# Patient Record
Sex: Female | Born: 2000 | Race: White | Hispanic: No | Marital: Single | Smoking: Never smoker
Health system: Southern US, Community
[De-identification: ages and names within clinical notes are randomized; demographics above are authoritative.]

## PROBLEM LIST (undated history)

## (undated) DIAGNOSIS — R480 Dyslexia and alexia: Secondary | ICD-10-CM

## (undated) DIAGNOSIS — H539 Unspecified visual disturbance: Secondary | ICD-10-CM

## (undated) HISTORY — DX: Unspecified visual disturbance: H53.9

## (undated) HISTORY — PX: OTHER SURGICAL HISTORY: SHX169

## (undated) HISTORY — DX: Dyslexia and alexia: R48.0

---
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
## 2020-04-23 ENCOUNTER — Ambulatory Visit (INDEPENDENT_AMBULATORY_CARE_PROVIDER_SITE_OTHER): Payer: BC Managed Care – PPO | Admitting: Pediatrics

## 2020-04-23 ENCOUNTER — Encounter: Payer: Self-pay | Admitting: Pediatrics

## 2020-04-23 ENCOUNTER — Other Ambulatory Visit: Payer: Self-pay

## 2020-04-23 VITALS — Wt 133.4 lb

## 2020-04-23 DIAGNOSIS — R3989 Other symptoms and signs involving the genitourinary system: Secondary | ICD-10-CM | POA: Diagnosis not present

## 2020-04-23 DIAGNOSIS — R300 Dysuria: Secondary | ICD-10-CM | POA: Diagnosis not present

## 2020-04-23 LAB — POCT URINALYSIS DIPSTICK
Bilirubin, UA: NEGATIVE
Blood, UA: 250
Glucose, UA: NEGATIVE
Ketones, UA: NEGATIVE
Nitrite, UA: POSITIVE
Protein, UA: POSITIVE — AB
Spec Grav, UA: 1.025 (ref 1.010–1.025)
Urobilinogen, UA: 0.2 E.U./dL
pH, UA: 5 (ref 5.0–8.0)

## 2020-04-23 MED ORDER — NITROFURANTOIN MONOHYD MACRO 100 MG PO CAPS: 100.0000 mg | ORAL_CAPSULE | Freq: Two times a day (BID) | ORAL | 0 refills | Status: AC

## 2020-04-23 NOTE — Progress Notes (Signed)
Subjective:     History was provided by the patient. Gabriela Clay is a 19 y.o. female here for evaluation of dysuria and hematuria beginning this morning. Fever has been absent. Other associated symptoms include: abdominal pain and back pain. Symptoms which are not present include: constipation, diarrhea, headache, urinary frequency, urinary incontinence, urinary urgency, vaginal discharge, vaginal itching and vomiting. UTI history: no recent UTI's.  The following portions of the patient's history were reviewed and updated as appropriate: allergies, current medications, past family history, past medical history, past social history, past surgical history and problem list.  Review of Systems Pertinent items are noted in HPI    Objective:    Wt 133 lb 6.4 oz (60.5 kg)  General: alert, cooperative, appears stated age and no distress  Abdomen: soft, non-tender, without masses or organomegaly  CVA Tenderness: absent  GU: exam deferred   Lab review Results for orders placed or performed in visit on 04/23/20 (from the past 24 hour(s))  POCT urinalysis dipstick     Status: Abnormal   Collection Time: 04/23/20  3:11 PM  Result Value Ref Range   Color, UA amber    Clarity, UA cloudy    Glucose, UA Negative Negative   Bilirubin, UA neg    Ketones, UA neg    Spec Grav, UA 1.025 1.010 - 1.025   Blood, UA 250    pH, UA 5.0 5.0 - 8.0   Protein, UA Positive (A) Negative   Urobilinogen, UA 0.2 0.2 or 1.0 E.U./dL   Nitrite, UA pos    Leukocytes, UA Large (3+) (A) Negative   Appearance     Odor       Assessment:    Suspicious for UTI.    Plan:    Antibiotic as ordered; complete course.   UCS pending, will change abx if needed Follow up as needed

## 2020-04-23 NOTE — Patient Instructions (Signed)
1 capsul Macrobid 2 times a day for 10 days Drink plenty of water AZO and/or cranberry juice

## 2020-04-26 LAB — URINE CULTURE
MICRO NUMBER:: 10522268
SPECIMEN QUALITY:: ADEQUATE

## 2020-07-15 IMAGING — DX DG CHEST 2V
2 series · 2 of 2 positions shown · non-contrast
Comparison: None.

CLINICAL DATA: Fever.  Inguinal adenopathy

EXAM:
CHEST - 2 VIEW

[w chest lat]
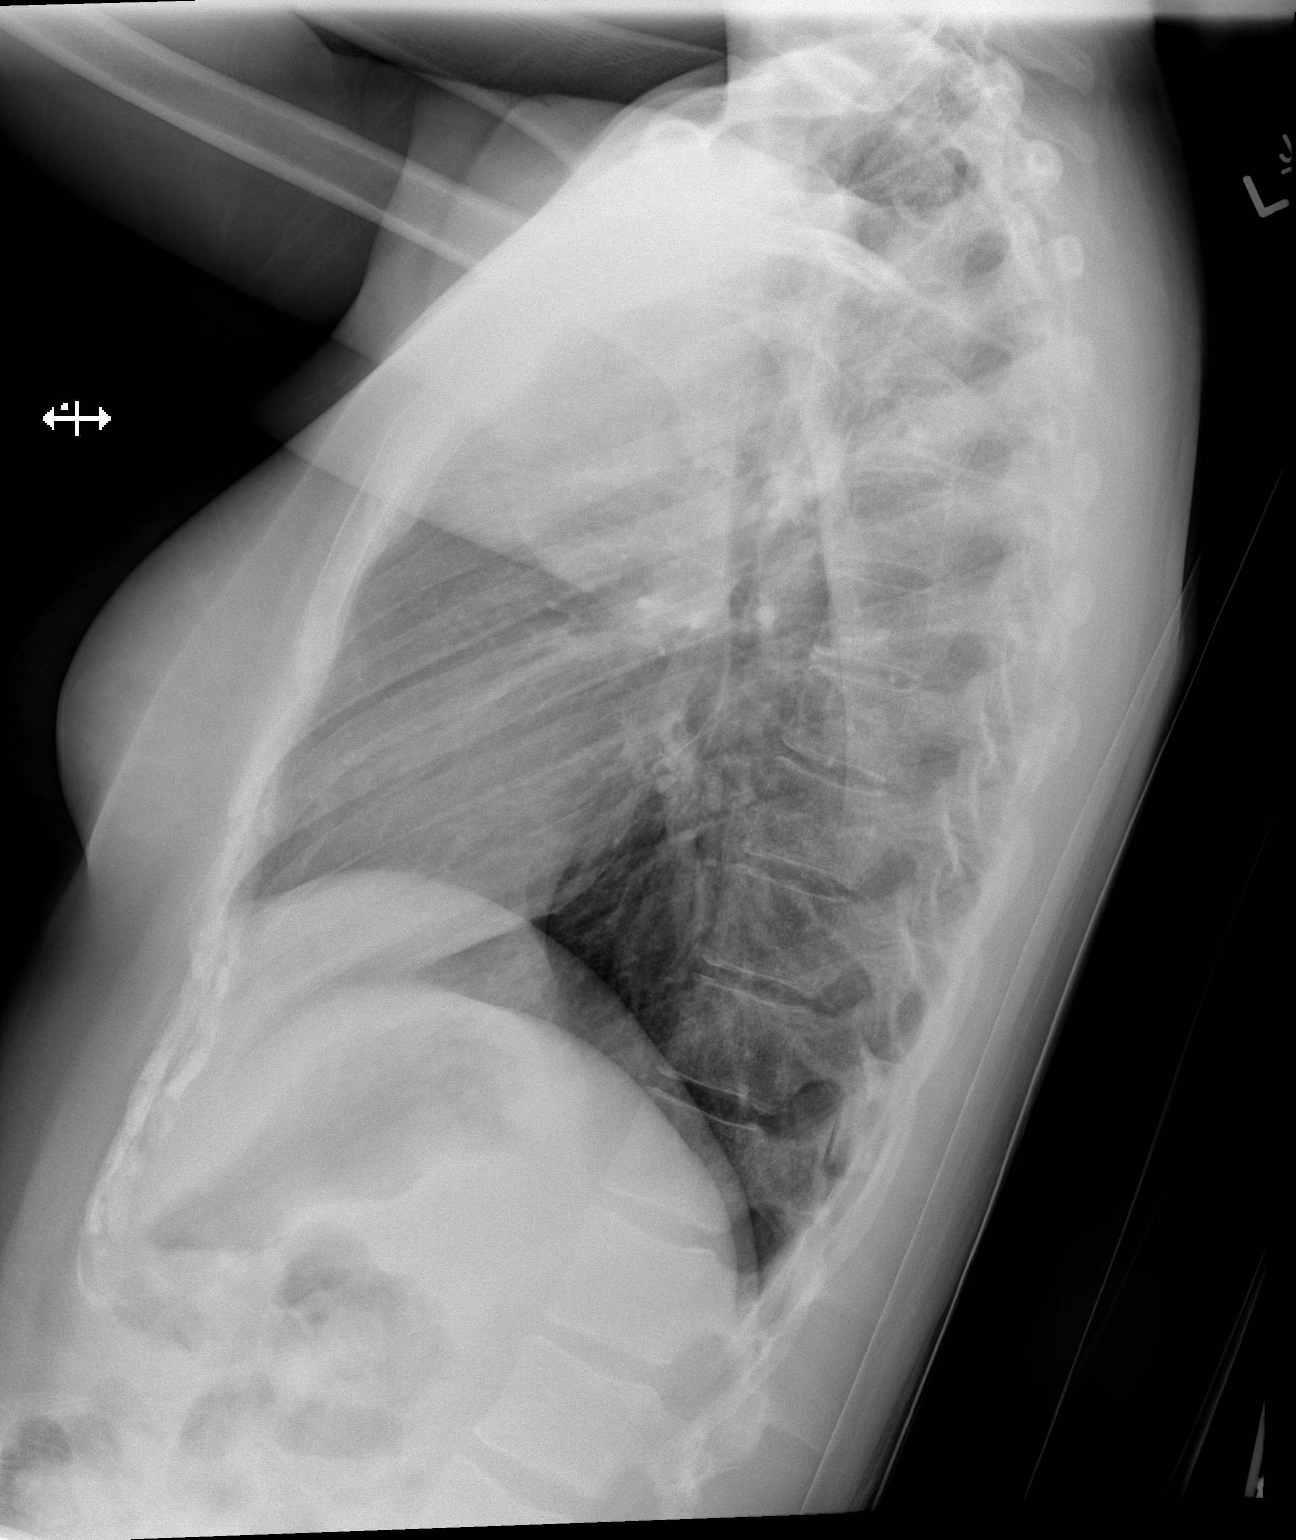

[x chest ap]
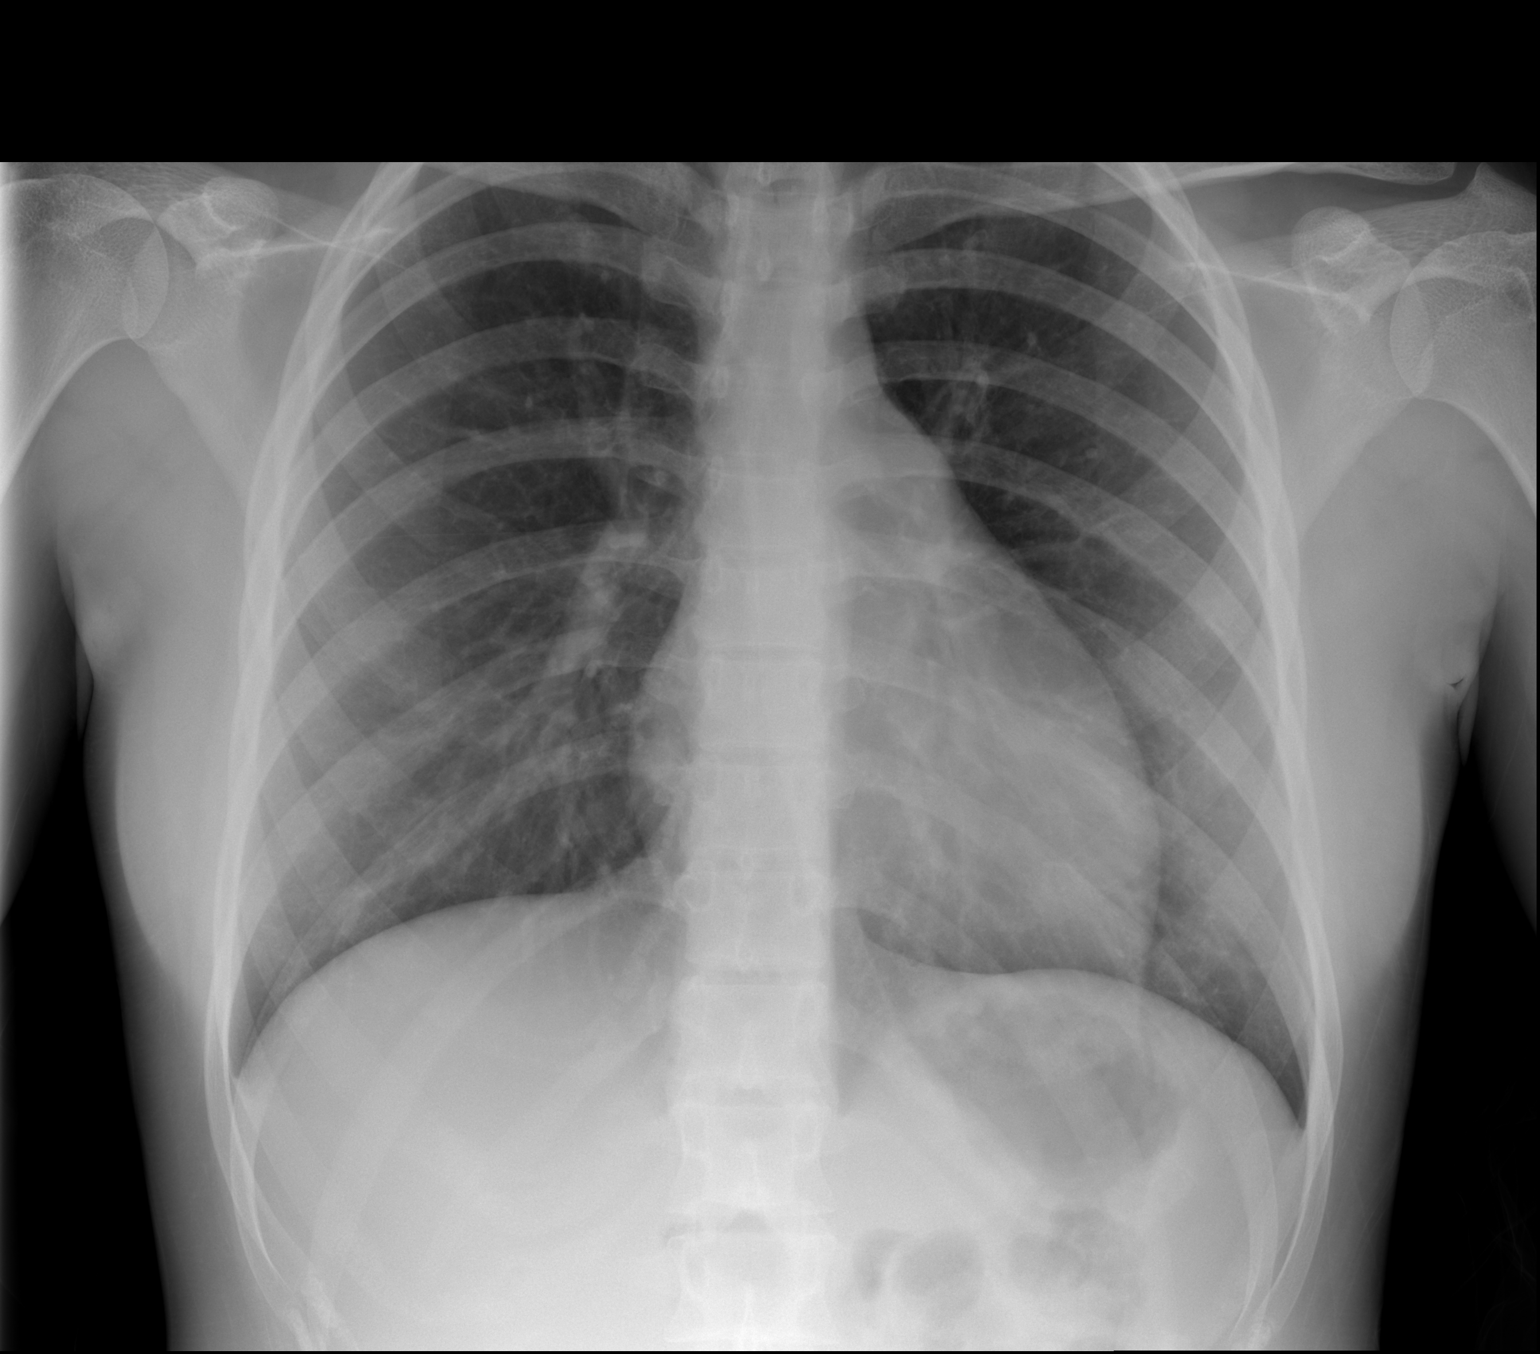

[2 of 2 positions shown; findings below may reference images not displayed]

FINDINGS: Lungs are clear. Heart size and pulmonary vascularity are normal. No
thoracic adenopathy demonstrable. No bone lesions.
IMPRESSION: No abnormality noted. In particular, no demonstrable thoracic
adenopathy. Lungs clear.

## 2020-07-22 ENCOUNTER — Ambulatory Visit (INDEPENDENT_AMBULATORY_CARE_PROVIDER_SITE_OTHER): Payer: BC Managed Care – PPO | Admitting: Pediatrics

## 2020-07-22 ENCOUNTER — Encounter: Payer: Self-pay | Admitting: Pediatrics

## 2020-07-22 ENCOUNTER — Other Ambulatory Visit: Payer: Self-pay

## 2020-07-22 VITALS — Wt 133.6 lb

## 2020-07-22 DIAGNOSIS — J029 Acute pharyngitis, unspecified: Secondary | ICD-10-CM

## 2020-07-22 LAB — POCT RAPID STREP A (OFFICE): Rapid Strep A Screen: NEGATIVE

## 2020-07-22 NOTE — Patient Instructions (Addendum)
Rapid strep negative, throat culture pending- no news is good news Ibuprofen every 6 hours as needed Warm salt water gargles and/or hot tea with honey Follow up as needed

## 2020-07-22 NOTE — Progress Notes (Signed)
Subjective:     Gabriela Clay is a 19 y.o. female who presents for evaluation of sore throat and right ear pain. Her throat started to hurt 4 days ago. She denies any fevers.    Treatment to date: none.  The following portions of the patient's history were reviewed and updated as appropriate: allergies, current medications, past family history, past medical history, past social history, past surgical history and problem list.  Review of Systems Pertinent items are noted in HPI.   Objective:    Wt 133 lb 9.6 oz (60.6 kg)  General appearance: alert, cooperative, appears stated age and no distress Head: Normocephalic, without obvious abnormality, atraumatic Eyes: conjunctivae/corneas clear. PERRL, EOM's intact. Fundi benign. Ears: normal TM's and external ear canals both ears Nose: Nares normal. Septum midline. Mucosa normal. No drainage or sinus tenderness. Throat: lips, mucosa, and tongue normal; teeth and gums normal Neck: no adenopathy, no carotid bruit, no JVD, supple, symmetrical, trachea midline and thyroid not enlarged, symmetric, no tenderness/mass/nodules Lungs: clear to auscultation bilaterally Heart: regular rate and rhythm, S1, S2 normal, no murmur, click, rub or gallop   Assessment:    viral pharyngitis   Plan:    Discussed diagnosis and treatment of URI. Suggested symptomatic OTC remedies. Nasal saline spray for congestion. Follow up as needed.   Rapid strep negative, throat culture pending. Will call patient and start on abx if culture results positive.

## 2020-07-25 LAB — CULTURE, GROUP A STREP
MICRO NUMBER:: 10864715
SPECIMEN QUALITY:: ADEQUATE

## 2020-09-05 DIAGNOSIS — Z6824 Body mass index (BMI) 24.0-24.9, adult: Secondary | ICD-10-CM | POA: Diagnosis not present

## 2020-09-05 DIAGNOSIS — N390 Urinary tract infection, site not specified: Secondary | ICD-10-CM | POA: Diagnosis not present

## 2021-09-02 DIAGNOSIS — A09 Infectious gastroenteritis and colitis, unspecified: Secondary | ICD-10-CM | POA: Diagnosis not present

## 2021-09-24 DIAGNOSIS — M26622 Arthralgia of left temporomandibular joint: Secondary | ICD-10-CM | POA: Diagnosis not present

## 2021-09-24 DIAGNOSIS — Z6826 Body mass index (BMI) 26.0-26.9, adult: Secondary | ICD-10-CM | POA: Diagnosis not present

## 2022-03-21 DIAGNOSIS — Z3202 Encounter for pregnancy test, result negative: Secondary | ICD-10-CM | POA: Diagnosis not present

## 2022-03-21 DIAGNOSIS — T384X5A Adverse effect of oral contraceptives, initial encounter: Secondary | ICD-10-CM | POA: Diagnosis not present

## 2022-03-21 DIAGNOSIS — N921 Excessive and frequent menstruation with irregular cycle: Secondary | ICD-10-CM | POA: Diagnosis not present

## 2022-03-21 DIAGNOSIS — Z3009 Encounter for other general counseling and advice on contraception: Secondary | ICD-10-CM | POA: Diagnosis not present

## 2022-09-09 DIAGNOSIS — Z3491 Encounter for supervision of normal pregnancy, unspecified, first trimester: Secondary | ICD-10-CM | POA: Diagnosis not present

## 2022-09-29 DIAGNOSIS — N911 Secondary amenorrhea: Secondary | ICD-10-CM | POA: Diagnosis not present

## 2022-10-08 DIAGNOSIS — Z3481 Encounter for supervision of other normal pregnancy, first trimester: Secondary | ICD-10-CM | POA: Diagnosis not present

## 2022-10-08 DIAGNOSIS — Z3685 Encounter for antenatal screening for Streptococcus B: Secondary | ICD-10-CM | POA: Diagnosis not present

## 2022-10-08 LAB — OB RESULTS CONSOLE ABO/RH: RH Type: POSITIVE

## 2022-10-08 LAB — HEPATITIS C ANTIBODY: HCV Ab: NEGATIVE

## 2022-10-08 LAB — OB RESULTS CONSOLE RPR: RPR: NONREACTIVE

## 2022-10-08 LAB — OB RESULTS CONSOLE ANTIBODY SCREEN: Antibody Screen: NEGATIVE

## 2022-10-08 LAB — OB RESULTS CONSOLE HIV ANTIBODY (ROUTINE TESTING): HIV: NONREACTIVE

## 2022-10-08 LAB — OB RESULTS CONSOLE RUBELLA ANTIBODY, IGM: Rubella: IMMUNE

## 2022-10-08 LAB — OB RESULTS CONSOLE HEPATITIS B SURFACE ANTIGEN: Hepatitis B Surface Ag: NEGATIVE

## 2022-10-14 DIAGNOSIS — Z3A09 9 weeks gestation of pregnancy: Secondary | ICD-10-CM | POA: Diagnosis not present

## 2022-10-14 DIAGNOSIS — K625 Hemorrhage of anus and rectum: Secondary | ICD-10-CM | POA: Diagnosis not present

## 2022-10-14 DIAGNOSIS — O2241 Hemorrhoids in pregnancy, first trimester: Secondary | ICD-10-CM | POA: Diagnosis not present

## 2022-10-14 DIAGNOSIS — O99611 Diseases of the digestive system complicating pregnancy, first trimester: Secondary | ICD-10-CM | POA: Diagnosis not present

## 2022-10-14 DIAGNOSIS — Z3481 Encounter for supervision of other normal pregnancy, first trimester: Secondary | ICD-10-CM | POA: Diagnosis not present

## 2022-10-14 DIAGNOSIS — K648 Other hemorrhoids: Secondary | ICD-10-CM | POA: Diagnosis not present

## 2022-10-21 DIAGNOSIS — Z113 Encounter for screening for infections with a predominantly sexual mode of transmission: Secondary | ICD-10-CM | POA: Diagnosis not present

## 2022-10-21 DIAGNOSIS — Z124 Encounter for screening for malignant neoplasm of cervix: Secondary | ICD-10-CM | POA: Diagnosis not present

## 2022-10-21 DIAGNOSIS — Z3481 Encounter for supervision of other normal pregnancy, first trimester: Secondary | ICD-10-CM | POA: Diagnosis not present

## 2022-10-21 DIAGNOSIS — Z3A10 10 weeks gestation of pregnancy: Secondary | ICD-10-CM | POA: Diagnosis not present

## 2022-10-21 LAB — OB RESULTS CONSOLE GC/CHLAMYDIA
Chlamydia: NEGATIVE
Neisseria Gonorrhea: NEGATIVE

## 2022-11-30 NOTE — L&D Delivery Note (Signed)
Operative Note  PROCEDURE DATE: 05/18/23   PREOPERATIVE DIAGNOSIS: Nonreassuring fetal heart tracing   POSTOPERATIVE DIAGNOSIS: The same   PROCEDURE:    Primary Low Transverse Cesarean Section   SURGEON:  Dr. Austin Vern Prestia   INDICATIONS: This is a 22 y.o. yo G1P0 at [redacted]w[redacted]d requiring cesarean section secondary to nonreassuring fetal heart tracing.  She underwent elective induction of labor and did not progress beyond 4 cm dilation.  FHT was notable for bouts of repetitive late decels after AROM and these resolved with position changes.  We were unable to begin pitocin due to intermittent late decelerations and labor was inadequate.  FHT then became more notable for recurrent and deeper late decelerations.   Decision made to proceed with LTCS. The risks of cesarean section discussed with the patient included but were not limited to: bleeding which may require transfusion or reoperation; infection which may require antibiotics; injury to bowel, bladder, ureters or other surrounding organs; injury to the fetus; need for additional procedures including hysterectomy in the event of a life-threatening hemorrhage; placental abnormalities wth subsequent pregnancies, incisional problems, thromboembolic phenomenon and other postoperative/anesthesia complications. The patient agreed with the proposed plan, giving informed consent for the procedure.     FINDINGS:  Viable female infant in vertex presentation, OP and asynclitic, APGARs p4 and 8,  Weight pending, Amniotic fluid light mec stained,  Intact placenta, three vessel cord.  Grossly normal uterus.  Arterial umbilical cord gas pH 7.28. Venous is 7.16, given lower reported venous this value may actually represent arterial.  .   ANESTHESIA:    Epidural ESTIMATED BLOOD LOSS: per record SPECIMENS: Placenta for routine COMPLICATIONS: None immediate    PROCEDURE IN DETAIL:  The patient received intravenous antibiotics (2g Ancef and azithromycin) and had  sequential compression devices applied to her lower extremities while in the preoperative area.  She was then taken to the operating room where epidural anesthesia was dosed up to surgical level and was found to be adequate. She was then placed in a dorsal supine position with a leftward tilt, and prepped and draped in a sterile manner.  A foley catheter was placed into her bladder and attached to constant gravity.  After an adequate timeout was performed, a Pfannenstiel skin incision was made with scalpel and carried through to the underlying layer of fascia. The fascia was incised in the midline and this incision was extended bilaterally using the Mayo scissors. Kocher clamps were applied to the superior aspect of the fascial incision and the underlying rectus muscles were dissected off bluntly. A similar process was carried out on the inferior aspect of the facial incision. The rectus muscles were separated in the midline bluntly and the peritoneum was entered bluntly.  A bladder flap was created sharply and developed bluntly. A transverse hysterotomy was made with a scalpel and extended bilaterally bluntly. The bladder blade was then removed. The infant was successfully delivered, and cord was clamped and cut and infant was handed over to awaiting neonatology team. Uterine massage was then administered and the placenta delivered intact with three-vessel cord. Cord gases collected. The uterus was cleared of clot and debris.  The hysterotomy was closed with 0 vicryl.  A second imbricating suture of 0-vicryl was used to reinforce the incision and aid in hemostasis.The fascia was closed with 0-Vicryl in a running fashion with good restoration of anatomy.  The subcutaneus tissue was irrigated and was reapproximated using running plain gut.  The skin was closed with 4-0 Vicryl in   a subcuticular fashion.  All surgical site and was hemostatic at end of procedure without any further bleeding on exam.    Pt tolerated  the procedure well. All sponge/lap/needle counts were correct  X 2. Pt taken to recovery room in stable condition.     Austin Dejon Lukas MD  

## 2022-12-24 DIAGNOSIS — Z363 Encounter for antenatal screening for malformations: Secondary | ICD-10-CM | POA: Diagnosis not present

## 2022-12-24 DIAGNOSIS — Z3A19 19 weeks gestation of pregnancy: Secondary | ICD-10-CM | POA: Diagnosis not present

## 2023-01-21 DIAGNOSIS — Z3A23 23 weeks gestation of pregnancy: Secondary | ICD-10-CM | POA: Diagnosis not present

## 2023-01-21 DIAGNOSIS — Z362 Encounter for other antenatal screening follow-up: Secondary | ICD-10-CM | POA: Diagnosis not present

## 2023-02-17 DIAGNOSIS — Z3480 Encounter for supervision of other normal pregnancy, unspecified trimester: Secondary | ICD-10-CM | POA: Diagnosis not present

## 2023-02-17 DIAGNOSIS — Z23 Encounter for immunization: Secondary | ICD-10-CM | POA: Diagnosis not present

## 2023-04-20 DIAGNOSIS — Z3685 Encounter for antenatal screening for Streptococcus B: Secondary | ICD-10-CM | POA: Diagnosis not present

## 2023-04-27 LAB — OB RESULTS CONSOLE GBS: GBS: POSITIVE

## 2023-05-04 ENCOUNTER — Inpatient Hospital Stay (HOSPITAL_COMMUNITY)
Admission: AD | Admit: 2023-05-04 | Discharge: 2023-05-05 | Disposition: A | Discharge status: Home / Self Care | Payer: BC Managed Care – PPO | Attending: Obstetrics and Gynecology | Admitting: Obstetrics and Gynecology

## 2023-05-04 ENCOUNTER — Encounter (HOSPITAL_COMMUNITY): Payer: Self-pay | Admitting: Obstetrics and Gynecology

## 2023-05-04 DIAGNOSIS — O471 False labor at or after 37 completed weeks of gestation: Secondary | ICD-10-CM | POA: Insufficient documentation

## 2023-05-04 DIAGNOSIS — O479 False labor, unspecified: Secondary | ICD-10-CM

## 2023-05-04 DIAGNOSIS — Z3A38 38 weeks gestation of pregnancy: Secondary | ICD-10-CM | POA: Insufficient documentation

## 2023-05-04 NOTE — MAU Note (Signed)
..  Gabriela Clay is a 22 y.o. at [redacted]w[redacted]d here in MAU reporting:   Contractions every: 8 minutes Onset of ctx: Today 7pm Pain score: 8/10  ROM: Intact Vaginal Bleeding: None   Fetal Movement: Reports positive FM FHT: Patient taken straight to room  Vitals:   05/04/23 2249  BP: 116/82  Pulse: (!) 107  Resp: 18  Temp: 98.2 F (36.8 C)       GBS: Positive HSV: Denies hx of HSV Lab orders placed from triage: MAU Labor Eval

## 2023-05-05 DIAGNOSIS — O479 False labor, unspecified: Secondary | ICD-10-CM | POA: Diagnosis not present

## 2023-05-05 DIAGNOSIS — Z3A38 38 weeks gestation of pregnancy: Secondary | ICD-10-CM

## 2023-05-05 DIAGNOSIS — O471 False labor at or after 37 completed weeks of gestation: Secondary | ICD-10-CM | POA: Diagnosis not present

## 2023-05-05 NOTE — MAU Provider Note (Signed)
None    S: [name] is a 22 y.o. G1P0 at [redacted]w[redacted]d  who presents to MAU today complaining contractions q 8 minutes  She denies vaginal bleeding. She denies LOF. She reports normal fetal movement.    O: BP 112/78   Pulse 93   Temp 98.2 F (36.8 C) (Oral)   Resp 18   Ht 5\' 3"  (1.6 m)   Wt 78.5 kg   SpO2 98%   BMI 30.66 kg/m  GENERAL: Well-developed, well-nourished female in no acute distress.  HEAD: Normocephalic, atraumatic.  CHEST: Normal effort of breathing, regular heart rate ABDOMEN: Soft, nontender, gravid  Cervical exam:  Dilation: Fingertip Effacement (%): 50 Cervical Position: Posterior Exam by:: Anastasio Champion, RN   Fetal Monitoring: Baseline: 145 Variability: average Accelerations: present Decelerations: none Contractions: irregular, q7-7min   A: SIUP at [redacted]w[redacted]d  False labor  P: Discharge home Labor precautions Encouraged to return if she develops worsening of symptoms, increase in pain, fever, or other concerning symptoms.     Aviva Signs, CNM 05/05/2023 12:08 AM

## 2023-05-09 ENCOUNTER — Encounter (HOSPITAL_COMMUNITY): Payer: Self-pay | Admitting: Obstetrics and Gynecology

## 2023-05-09 ENCOUNTER — Inpatient Hospital Stay (HOSPITAL_COMMUNITY)
Admission: AD | Admit: 2023-05-09 | Discharge: 2023-05-09 | Disposition: A | Discharge status: Home / Self Care | Payer: BC Managed Care – PPO | Attending: Obstetrics and Gynecology | Admitting: Obstetrics and Gynecology

## 2023-05-09 DIAGNOSIS — Z3A38 38 weeks gestation of pregnancy: Secondary | ICD-10-CM | POA: Insufficient documentation

## 2023-05-09 DIAGNOSIS — O26893 Other specified pregnancy related conditions, third trimester: Secondary | ICD-10-CM | POA: Insufficient documentation

## 2023-05-09 DIAGNOSIS — O471 False labor at or after 37 completed weeks of gestation: Secondary | ICD-10-CM

## 2023-05-09 DIAGNOSIS — R030 Elevated blood-pressure reading, without diagnosis of hypertension: Secondary | ICD-10-CM | POA: Insufficient documentation

## 2023-05-09 LAB — COMPREHENSIVE METABOLIC PANEL
ALT: 8 U/L (ref 0–44)
AST: 16 U/L (ref 15–41)
Albumin: 2.8 g/dL — ABNORMAL LOW (ref 3.5–5.0)
Alkaline Phosphatase: 228 U/L — ABNORMAL HIGH (ref 38–126)
Anion gap: 11 (ref 5–15)
BUN: 6 mg/dL (ref 6–20)
CO2: 21 mmol/L — ABNORMAL LOW (ref 22–32)
Calcium: 9.3 mg/dL (ref 8.9–10.3)
Chloride: 102 mmol/L (ref 98–111)
Creatinine, Ser: 0.7 mg/dL (ref 0.44–1.00)
GFR, Estimated: >60 mL/min (ref >60)
Glucose, Bld: 82 mg/dL (ref 70–99)
Potassium: 5 mmol/L (ref 3.5–5.1)
Sodium: 134 mmol/L — ABNORMAL LOW (ref 135–145)
Total Bilirubin: 0.5 mg/dL (ref 0.3–1.2)
Total Protein: 6.8 g/dL (ref 6.5–8.1)

## 2023-05-09 LAB — CBC
HCT: 39.7 % (ref 36.0–46.0)
Hemoglobin: 12.6 g/dL (ref 12.0–15.0)
MCH: 27.9 pg (ref 26.0–34.0)
MCHC: 31.7 g/dL (ref 30.0–36.0)
MCV: 88 fL (ref 80.0–100.0)
Platelets: 289 10*3/uL (ref 150–400)
RBC: 4.51 MIL/uL (ref 3.87–5.11)
RDW: 12.3 % (ref 11.5–15.5)
WBC: 10.6 10*3/uL — ABNORMAL HIGH (ref 4.0–10.5)
nRBC: 0 % (ref 0.0–0.2)

## 2023-05-09 LAB — PROTEIN / CREATININE RATIO, URINE
Creatinine, Urine: 128 mg/dL
Protein Creatinine Ratio: 0.17 mg/mg{Cre} — ABNORMAL HIGH (ref 0.00–0.15)
Total Protein, Urine: 22 mg/dL

## 2023-05-09 NOTE — MAU Note (Signed)
..  Gabriela Clay is a 22 y.o. at [redacted]w[redacted]d here in MAU reporting: CTX since yesterday around 1000. She reports they were every 8 minutes and finally fell asleep around 0600. She reports she woke back up around 0800 and began tracking her CTX's again and they were every 3 minutes and her app told her to come to the hospital. She reports she lives an hour away in [place]. Reports 3 softer bowel movements since yesterday. Denies VB. +FM.  GBS+.  Onset of complaint: 1000 yesterday Pain score: 9/10 lower abdomen and back  FHT: wearing a romper - unable to assess Lab orders placed from triage:

## 2023-05-09 NOTE — Discharge Instructions (Signed)
Reasons to return to MAU at [facility]:  1.  Contractions are  5 minutes apart or less, each last 1 minute, these have been going on for 1-2 hours, and you cannot walk or talk during them 2.  You have a large gush of fluid, or a trickle of fluid that will not stop and you have to wear a pad 3.  You have bleeding that is bright red, heavier than spotting--like menstrual bleeding (spotting can be normal in early labor or after a check of your cervix) 4.  You do not feel the baby moving like he/she normally does  

## 2023-05-09 NOTE — MAU Provider Note (Signed)
[name] is a G1P0 at [redacted]w[redacted]d seen in MAU for labor. RN labor check, seen by provider for elevated BP x 1.  SVE by RN Dilation: Fingertip Effacement (%): Thick Cervical Position: Posterior Station: Ballotable Exam by:: Valla Leaver, RN   NST - FHR: 130 bpm / moderate variability / accels present / decels absent NST reactive / TOCO: regular every 2-10 mins   Patient Vitals for the past 24 hrs:  BP Temp Temp src Pulse Resp SpO2 Height Weight  05/09/23 1211 98/71 -- -- 79 -- -- -- --  05/09/23 1208 109/72 -- -- 87 18 -- -- --  05/09/23 1201 (!) 149/90 -- -- 96 -- -- -- --  05/09/23 1151 115/80 -- -- 86 -- -- -- --  05/09/23 1147 115/84 -- -- 94 -- -- -- --  05/09/23 1137 123/83 98.3 F (36.8 C) Oral 94 16 100 % -- --  05/09/23 1134 -- -- -- -- -- -- 5\' 3"  (1.6 m) 76.8 kg    Results for orders placed or performed during the hospital encounter of 05/09/23 (from the past 24 hour(s))  Protein / creatinine ratio, urine     Status: Abnormal   Collection Time: 05/09/23 12:25 PM  Result Value Ref Range   Creatinine, Urine 128 mg/dL   Total Protein, Urine 22 mg/dL   Protein Creatinine Ratio 0.17 (H) 0.00 - 0.15 mg/mg[Cre]  CBC     Status: Abnormal   Collection Time: 05/09/23 12:53 PM  Result Value Ref Range   WBC 10.6 (H) 4.0 - 10.5 K/uL   RBC 4.51 3.87 - 5.11 MIL/uL   Hemoglobin 12.6 12.0 - 15.0 g/dL   HCT 65.7 84.6 - 96.2 %   MCV 88.0 80.0 - 100.0 fL   MCH 27.9 26.0 - 34.0 pg   MCHC 31.7 30.0 - 36.0 g/dL   RDW 95.2 84.1 - 32.4 %   Platelets 289 150 - 400 K/uL   nRBC 0.0 0.0 - 0.2 %  Comprehensive metabolic panel     Status: Abnormal   Collection Time: 05/09/23 12:53 PM  Result Value Ref Range   Sodium 134 (L) 135 - 145 mmol/L   Potassium 5.0 3.5 - 5.1 mmol/L   Chloride 102 98 - 111 mmol/L   CO2 21 (L) 22 - 32 mmol/L   Glucose, Bld 82 70 - 99 mg/dL   BUN 6 6 - 20 mg/dL   Creatinine, Ser 4.01 0.44 - 1.00 mg/dL   Calcium 9.3 8.9 - 02.7 mg/dL   Total Protein 6.8 6.5 - 8.1  g/dL   Albumin 2.8 (L) 3.5 - 5.0 g/dL   AST 16 15 - 41 U/L   ALT 8 0 - 44 U/L   Alkaline Phosphatase 228 (H) 38 - 126 U/L   Total Bilirubin 0.5 0.3 - 1.2 mg/dL   GFR, Estimated >25 >36 mL/min   Anion gap 11 5 - 15    MDM: CNM performed medical screen for elevated BP x 1 in MAU.  PEC labs ordered and wnl. No s/sx of PEC.  Pt BP grossly normal except for one mild range BP.    Plan:  D/C home with labor precautions Keep scheduled appt with [facility]  Sharen Counter, CNM  05/09/2023 2:13 PM

## 2023-05-09 NOTE — Progress Notes (Signed)
Patient laying on BP cuff with arm bent

## 2023-05-14 ENCOUNTER — Telehealth (HOSPITAL_COMMUNITY): Payer: Self-pay | Admitting: *Deleted

## 2023-05-14 NOTE — Telephone Encounter (Unsigned)
Preadmission screen  

## 2023-05-14 NOTE — H&P (Signed)
Gabriela Clay is a 22 y.o. female presenting for eIOL. Pregnancy uncomplicated except for GBS positive. OB History     Gravida  1   Para      Term      Preterm      AB      Living         SAB      IAB      Ectopic      Multiple      Live Births             Past Medical History:  Diagnosis Date   Dyslexia    Reading & writing   Vision abnormalities    wears glasses for reading   Past Surgical History:  Procedure Laterality Date   none     Family History: family history includes Arthritis in her maternal grandfather; Asthma in her paternal grandmother; Cancer in her maternal grandmother and paternal grandmother; Coronary artery disease in her father; Early death (age of onset: 90) in her maternal grandfather; Heart disease (age of onset: 19) in her paternal grandfather; Hyperlipidemia in her father; Hypertension in her father; Kidney Stones in her father. Social History:  reports that she has never smoked. She has never used smokeless tobacco. She reports that she does not drink alcohol and does not use drugs.     Maternal Diabetes: No Genetic Screening: Normal Maternal Ultrasounds/Referrals: Normal Fetal Ultrasounds or other Referrals:  None Maternal Substance Abuse:  No Significant Maternal Medications:  None Significant Maternal Lab Results:  Group B Strep positive Number of Prenatal Visits:greater than 3 verified prenatal visits Other Comments:  None  Review of Systems History   There were no vitals taken for this visit. Exam Physical Exam  .(from office) NAD, A&O NWOB Abd soft, nondistended, gravid  Prenatal labs: ABO, Rh:   Antibody:   Rubella:   RPR:    HBsAg:    HIV:    GBS: Positive/-- (05/28 0000)   Assessment/Plan: 22 yo G1P0 @ 4 wga presenting for IOL s/s elective. Cervix unfavorable. Plan for cytotec followed by pitocin/AROM when more favorable.  GBS positive - PCN per protocol.   Ranae Pila 05/14/2023, 10:39  AM

## 2023-05-17 ENCOUNTER — Encounter (HOSPITAL_COMMUNITY): Payer: Self-pay | Admitting: Obstetrics and Gynecology

## 2023-05-17 ENCOUNTER — Other Ambulatory Visit: Payer: Self-pay

## 2023-05-17 ENCOUNTER — Inpatient Hospital Stay (HOSPITAL_COMMUNITY)
Admission: AD | Admit: 2023-05-17 | Discharge: 2023-05-20 | DRG: 788 | Disposition: A | Discharge status: Home / Self Care | Payer: BC Managed Care – PPO | Attending: Obstetrics and Gynecology | Admitting: Obstetrics and Gynecology

## 2023-05-17 DIAGNOSIS — Z3A00 Weeks of gestation of pregnancy not specified: Secondary | ICD-10-CM | POA: Diagnosis not present

## 2023-05-17 DIAGNOSIS — Z3490 Encounter for supervision of normal pregnancy, unspecified, unspecified trimester: Principal | ICD-10-CM

## 2023-05-17 DIAGNOSIS — O26893 Other specified pregnancy related conditions, third trimester: Secondary | ICD-10-CM | POA: Diagnosis not present

## 2023-05-17 DIAGNOSIS — Z3A40 40 weeks gestation of pregnancy: Secondary | ICD-10-CM

## 2023-05-17 DIAGNOSIS — O99824 Streptococcus B carrier state complicating childbirth: Principal | ICD-10-CM | POA: Diagnosis present

## 2023-05-17 DIAGNOSIS — Z051 Observation and evaluation of newborn for suspected infectious condition ruled out: Secondary | ICD-10-CM | POA: Diagnosis not present

## 2023-05-17 DIAGNOSIS — Z23 Encounter for immunization: Secondary | ICD-10-CM | POA: Diagnosis not present

## 2023-05-17 LAB — TYPE AND SCREEN
ABO/RH(D): O POS
Antibody Screen: NEGATIVE

## 2023-05-17 LAB — CBC
HCT: 36 % (ref 36.0–46.0)
Hemoglobin: 12.2 g/dL (ref 12.0–15.0)
MCH: 28.3 pg (ref 26.0–34.0)
MCHC: 33.9 g/dL (ref 30.0–36.0)
MCV: 83.5 fL (ref 80.0–100.0)
Platelets: 266 10*3/uL (ref 150–400)
RBC: 4.31 MIL/uL (ref 3.87–5.11)
RDW: 12.6 % (ref 11.5–15.5)
WBC: 8.6 10*3/uL (ref 4.0–10.5)
nRBC: 0 % (ref 0.0–0.2)

## 2023-05-17 LAB — HIV ANTIBODY (ROUTINE TESTING W REFLEX): HIV Screen 4th Generation wRfx: NONREACTIVE

## 2023-05-17 MED ORDER — TERBUTALINE SULFATE 1 MG/ML IJ SOLN: 0.2500 mg | Freq: Once | INTRAMUSCULAR | Status: DC | PRN

## 2023-05-17 MED ORDER — OXYTOCIN-SODIUM CHLORIDE 30-0.9 UT/500ML-% IV SOLN: 2.5000 [IU]/h | INTRAVENOUS | Status: DC

## 2023-05-17 MED ORDER — LACTATED RINGERS IV SOLN: INTRAVENOUS | Status: DC

## 2023-05-17 MED ORDER — ACETAMINOPHEN 325 MG PO TABS: 650.0000 mg | ORAL_TABLET | ORAL | Status: DC | PRN

## 2023-05-17 MED ORDER — OXYTOCIN-SODIUM CHLORIDE 30-0.9 UT/500ML-% IV SOLN
1.0000 m[IU]/min | INTRAVENOUS | Status: DC
  Administered 2023-05-18: 2 m[IU]/min via INTRAVENOUS
  Filled 2023-05-17: qty 500

## 2023-05-17 MED ORDER — ONDANSETRON HCL 4 MG/2ML IJ SOLN
4.0000 mg | Freq: Four times a day (QID) | INTRAMUSCULAR | Status: DC | PRN
  Administered 2023-05-17 – 2023-05-18 (×2): 4 mg via INTRAVENOUS
  Filled 2023-05-17: qty 2

## 2023-05-17 MED ORDER — SOD CITRATE-CITRIC ACID 500-334 MG/5ML PO SOLN
30.0000 mL | ORAL | Status: DC | PRN
  Administered 2023-05-18: 30 mL via ORAL
  Filled 2023-05-17: qty 30

## 2023-05-17 MED ORDER — FENTANYL CITRATE (PF) 100 MCG/2ML IJ SOLN
50.0000 ug | INTRAMUSCULAR | Status: DC | PRN
  Administered 2023-05-17 – 2023-05-18 (×2): 100 ug via INTRAVENOUS
  Filled 2023-05-17 (×2): qty 2

## 2023-05-17 MED ORDER — PENICILLIN G POT IN DEXTROSE 60000 UNIT/ML IV SOLN
3.0000 10*6.[IU] | INTRAVENOUS | Status: DC
  Administered 2023-05-17 – 2023-05-18 (×3): 3 10*6.[IU] via INTRAVENOUS
  Filled 2023-05-17 (×3): qty 50

## 2023-05-17 MED ORDER — OXYCODONE-ACETAMINOPHEN 5-325 MG PO TABS: 2.0000 | ORAL_TABLET | ORAL | Status: DC | PRN

## 2023-05-17 MED ORDER — OXYCODONE-ACETAMINOPHEN 5-325 MG PO TABS: 1.0000 | ORAL_TABLET | ORAL | Status: DC | PRN

## 2023-05-17 MED ORDER — OXYTOCIN BOLUS FROM INFUSION: 333.0000 mL | Freq: Once | INTRAVENOUS | Status: DC

## 2023-05-17 MED ORDER — MISOPROSTOL 25 MCG QUARTER TABLET
25.0000 ug | ORAL_TABLET | ORAL | Status: DC | PRN
  Administered 2023-05-17 (×2): 25 ug via VAGINAL
  Filled 2023-05-17 (×2): qty 1

## 2023-05-17 MED ORDER — ZOLPIDEM TARTRATE 5 MG PO TABS: 5.0000 mg | ORAL_TABLET | Freq: Every evening | ORAL | Status: DC | PRN

## 2023-05-17 MED ORDER — SODIUM CHLORIDE 0.9 % IV SOLN
5.0000 10*6.[IU] | Freq: Once | INTRAVENOUS | Status: AC
  Administered 2023-05-17: 5 10*6.[IU] via INTRAVENOUS
  Filled 2023-05-17: qty 5

## 2023-05-17 MED ORDER — MISOPROSTOL 50MCG HALF TABLET: 50.0000 ug | ORAL_TABLET | ORAL | Status: DC | PRN

## 2023-05-17 MED ORDER — LACTATED RINGERS IV SOLN
500.0000 mL | INTRAVENOUS | Status: DC | PRN
  Administered 2023-05-18: 500 mL via INTRAVENOUS

## 2023-05-17 MED ORDER — LIDOCAINE HCL (PF) 1 % IJ SOLN: 30.0000 mL | INTRAMUSCULAR | Status: DC | PRN

## 2023-05-17 MED ORDER — HYDROXYZINE HCL 50 MG PO TABS: 50.0000 mg | ORAL_TABLET | Freq: Four times a day (QID) | ORAL | Status: DC | PRN

## 2023-05-18 ENCOUNTER — Encounter (HOSPITAL_COMMUNITY): Payer: Self-pay | Admitting: Obstetrics and Gynecology

## 2023-05-18 ENCOUNTER — Inpatient Hospital Stay (HOSPITAL_COMMUNITY): Payer: BC Managed Care – PPO | Admitting: Anesthesiology

## 2023-05-18 ENCOUNTER — Encounter (HOSPITAL_COMMUNITY)
Admission: AD | Disposition: A | Discharge status: Home / Self Care | Payer: Self-pay | Source: Home / Self Care | Attending: Obstetrics and Gynecology

## 2023-05-18 LAB — RPR: RPR Ser Ql: NONREACTIVE

## 2023-05-18 SURGERY — Surgical Case
Anesthesia: Epidural

## 2023-05-18 MED ORDER — MORPHINE SULFATE (PF) 0.5 MG/ML IJ SOLN
INTRAMUSCULAR | Status: DC | PRN
  Administered 2023-05-18: 3 mg via EPIDURAL

## 2023-05-18 MED ORDER — PROMETHAZINE HCL 25 MG/ML IJ SOLN: 6.2500 mg | INTRAMUSCULAR | Status: DC | PRN

## 2023-05-18 MED ORDER — SIMETHICONE 80 MG PO CHEW
80.0000 mg | CHEWABLE_TABLET | Freq: Three times a day (TID) | ORAL | Status: DC
  Administered 2023-05-19 (×3): 80 mg via ORAL
  Filled 2023-05-18 (×3): qty 1

## 2023-05-18 MED ORDER — SODIUM CHLORIDE 0.9 % IV SOLN
INTRAVENOUS | Status: DC | PRN
  Administered 2023-05-18: 2 g via INTRAVENOUS

## 2023-05-18 MED ORDER — LIDOCAINE-EPINEPHRINE (PF) 2 %-1:200000 IJ SOLN
INTRAMUSCULAR | Status: AC
  Filled 2023-05-18: qty 20

## 2023-05-18 MED ORDER — SODIUM CHLORIDE 0.9 % IV SOLN
500.0000 mg | INTRAVENOUS | Status: AC
  Administered 2023-05-18: 500 mg via INTRAVENOUS

## 2023-05-18 MED ORDER — OXYCODONE HCL 5 MG PO TABS: 5.0000 mg | ORAL_TABLET | Freq: Once | ORAL | Status: DC | PRN

## 2023-05-18 MED ORDER — EPHEDRINE 5 MG/ML INJ: 10.0000 mg | INTRAVENOUS | Status: DC | PRN

## 2023-05-18 MED ORDER — OXYCODONE HCL 5 MG/5ML PO SOLN: 5.0000 mg | Freq: Once | ORAL | Status: DC | PRN

## 2023-05-18 MED ORDER — DIPHENHYDRAMINE HCL 50 MG/ML IJ SOLN: 12.5000 mg | INTRAMUSCULAR | Status: DC | PRN

## 2023-05-18 MED ORDER — LACTATED RINGERS IV SOLN
500.0000 mL | Freq: Once | INTRAVENOUS | Status: AC
  Administered 2023-05-18: 500 mL via INTRAVENOUS

## 2023-05-18 MED ORDER — WITCH HAZEL-GLYCERIN EX PADS: 1.0000 | MEDICATED_PAD | CUTANEOUS | Status: DC | PRN

## 2023-05-18 MED ORDER — ACETAMINOPHEN 10 MG/ML IV SOLN
INTRAVENOUS | Status: DC | PRN
  Administered 2023-05-18: 1000 mg via INTRAVENOUS

## 2023-05-18 MED ORDER — PHENYLEPHRINE HCL-NACL 20-0.9 MG/250ML-% IV SOLN
INTRAVENOUS | Status: AC
  Filled 2023-05-18: qty 250

## 2023-05-18 MED ORDER — FENTANYL-BUPIVACAINE-NACL 0.5-0.125-0.9 MG/250ML-% EP SOLN
12.0000 mL/h | EPIDURAL | Status: DC | PRN
  Filled 2023-05-18: qty 250

## 2023-05-18 MED ORDER — CEFAZOLIN SODIUM-DEXTROSE 2-4 GM/100ML-% IV SOLN: 2.0000 g | INTRAVENOUS | Status: DC

## 2023-05-18 MED ORDER — PHENYLEPHRINE HCL (PRESSORS) 10 MG/ML IV SOLN
INTRAVENOUS | Status: DC | PRN
  Administered 2023-05-18: 80 ug via INTRAVENOUS
  Administered 2023-05-18 (×2): 160 ug via INTRAVENOUS

## 2023-05-18 MED ORDER — LIDOCAINE-EPINEPHRINE (PF) 2 %-1:200000 IJ SOLN
INTRAMUSCULAR | Status: DC | PRN
  Administered 2023-05-18 (×2): 5 mL via EPIDURAL

## 2023-05-18 MED ORDER — LACTATED RINGERS IV SOLN: INTRAVENOUS | Status: DC

## 2023-05-18 MED ORDER — OXYTOCIN-SODIUM CHLORIDE 30-0.9 UT/500ML-% IV SOLN: 2.5000 [IU]/h | INTRAVENOUS | Status: AC

## 2023-05-18 MED ORDER — IBUPROFEN 600 MG PO TABS
600.0000 mg | ORAL_TABLET | Freq: Four times a day (QID) | ORAL | Status: DC
  Administered 2023-05-19 – 2023-05-20 (×5): 600 mg via ORAL
  Filled 2023-05-18 (×6): qty 1

## 2023-05-18 MED ORDER — COCONUT OIL OIL: 1.0000 | TOPICAL_OIL | Status: DC | PRN

## 2023-05-18 MED ORDER — FENTANYL-BUPIVACAINE-NACL 0.5-0.125-0.9 MG/250ML-% EP SOLN
EPIDURAL | Status: DC | PRN
  Administered 2023-05-18: 12 mL/h via EPIDURAL

## 2023-05-18 MED ORDER — DIBUCAINE (PERIANAL) 1 % EX OINT: 1.0000 | TOPICAL_OINTMENT | CUTANEOUS | Status: DC | PRN

## 2023-05-18 MED ORDER — KETOROLAC TROMETHAMINE 30 MG/ML IJ SOLN
INTRAMUSCULAR | Status: AC
  Filled 2023-05-18: qty 1

## 2023-05-18 MED ORDER — PRENATAL MULTIVITAMIN CH
1.0000 | ORAL_TABLET | Freq: Every day | ORAL | Status: DC
  Administered 2023-05-19: 1 via ORAL
  Filled 2023-05-18: qty 1

## 2023-05-18 MED ORDER — MENTHOL 3 MG MT LOZG: 1.0000 | LOZENGE | OROMUCOSAL | Status: DC | PRN

## 2023-05-18 MED ORDER — ACETAMINOPHEN 10 MG/ML IV SOLN
INTRAVENOUS | Status: AC
  Filled 2023-05-18: qty 100

## 2023-05-18 MED ORDER — SENNOSIDES-DOCUSATE SODIUM 8.6-50 MG PO TABS
2.0000 | ORAL_TABLET | Freq: Every day | ORAL | Status: DC
  Administered 2023-05-19: 2 via ORAL
  Filled 2023-05-18: qty 2

## 2023-05-18 MED ORDER — MORPHINE SULFATE (PF) 0.5 MG/ML IJ SOLN
INTRAMUSCULAR | Status: AC
  Filled 2023-05-18: qty 10

## 2023-05-18 MED ORDER — SIMETHICONE 80 MG PO CHEW: 80.0000 mg | CHEWABLE_TABLET | ORAL | Status: DC | PRN

## 2023-05-18 MED ORDER — ACETAMINOPHEN 325 MG PO TABS: 650.0000 mg | ORAL_TABLET | ORAL | Status: DC | PRN

## 2023-05-18 MED ORDER — OXYTOCIN-SODIUM CHLORIDE 30-0.9 UT/500ML-% IV SOLN
INTRAVENOUS | Status: AC
  Filled 2023-05-18: qty 500

## 2023-05-18 MED ORDER — PHENYLEPHRINE 80 MCG/ML (10ML) SYRINGE FOR IV PUSH (FOR BLOOD PRESSURE SUPPORT): 80.0000 ug | PREFILLED_SYRINGE | INTRAVENOUS | Status: DC | PRN

## 2023-05-18 MED ORDER — FENTANYL CITRATE (PF) 100 MCG/2ML IJ SOLN: 25.0000 ug | INTRAMUSCULAR | Status: DC | PRN

## 2023-05-18 MED ORDER — OXYCODONE HCL 5 MG PO TABS
5.0000 mg | ORAL_TABLET | ORAL | Status: DC | PRN
  Administered 2023-05-19: 5 mg via ORAL
  Filled 2023-05-18: qty 1

## 2023-05-18 MED ORDER — ONDANSETRON HCL 4 MG/2ML IJ SOLN
INTRAMUSCULAR | Status: AC
  Filled 2023-05-18: qty 2

## 2023-05-18 MED ORDER — LIDOCAINE HCL (PF) 1 % IJ SOLN
INTRAMUSCULAR | Status: DC | PRN
  Administered 2023-05-18: 2 mL via EPIDURAL
  Administered 2023-05-18: 10 mL via EPIDURAL

## 2023-05-18 MED ORDER — FENTANYL CITRATE (PF) 100 MCG/2ML IJ SOLN
INTRAMUSCULAR | Status: AC
  Filled 2023-05-18: qty 2

## 2023-05-18 MED ORDER — DEXAMETHASONE SODIUM PHOSPHATE 4 MG/ML IJ SOLN
INTRAMUSCULAR | Status: DC | PRN
  Administered 2023-05-18 (×2): 5 mg via INTRAVENOUS

## 2023-05-18 MED ORDER — SOD CITRATE-CITRIC ACID 500-334 MG/5ML PO SOLN: 30.0000 mL | ORAL | Status: DC

## 2023-05-18 MED ORDER — ZOLPIDEM TARTRATE 5 MG PO TABS: 5.0000 mg | ORAL_TABLET | Freq: Every evening | ORAL | Status: DC | PRN

## 2023-05-18 MED ORDER — KETOROLAC TROMETHAMINE 30 MG/ML IJ SOLN
30.0000 mg | Freq: Once | INTRAMUSCULAR | Status: AC | PRN
  Administered 2023-05-18: 30 mg via INTRAVENOUS

## 2023-05-18 MED ORDER — OXYTOCIN-SODIUM CHLORIDE 30-0.9 UT/500ML-% IV SOLN
INTRAVENOUS | Status: DC | PRN
  Administered 2023-05-18: 45 mL via INTRAVENOUS

## 2023-05-18 MED ORDER — DEXAMETHASONE SODIUM PHOSPHATE 10 MG/ML IJ SOLN
INTRAMUSCULAR | Status: AC
  Filled 2023-05-18: qty 1

## 2023-05-18 MED ORDER — LACTATED RINGERS IV SOLN: INTRAVENOUS | Status: DC | PRN

## 2023-05-18 MED ORDER — DIPHENHYDRAMINE HCL 25 MG PO CAPS: 25.0000 mg | ORAL_CAPSULE | Freq: Four times a day (QID) | ORAL | Status: DC | PRN

## 2023-05-18 SURGICAL SUPPLY — 32 items
APL PRP STRL LF DISP 70% ISPRP (MISCELLANEOUS) ×2
APL SKNCLS STERI-STRIP NONHPOA (GAUZE/BANDAGES/DRESSINGS) ×1
BENZOIN TINCTURE PRP APPL 2/3 (GAUZE/BANDAGES/DRESSINGS) IMPLANT
CHLORAPREP W/TINT 26 (MISCELLANEOUS) ×2 IMPLANT
CLAMP UMBILICAL CORD (MISCELLANEOUS) ×1 IMPLANT
CLIP FILSHIE TUBAL LIGA STRL (Clip) IMPLANT
CLOTH BEACON ORANGE TIMEOUT ST (SAFETY) ×1 IMPLANT
DRSG OPSITE POSTOP 4X10 (GAUZE/BANDAGES/DRESSINGS) ×1 IMPLANT
ELECT REM PT RETURN 9FT ADLT (ELECTROSURGICAL) ×1
ELECTRODE REM PT RTRN 9FT ADLT (ELECTROSURGICAL) ×1 IMPLANT
EXTRACTOR VACUUM M CUP 4 TUBE (SUCTIONS) IMPLANT
GAUZE SPONGE 4X4 12PLY STRL LF (GAUZE/BANDAGES/DRESSINGS) IMPLANT
GLOVE BIOGEL PI IND STRL 7.0 (GLOVE) ×2 IMPLANT
GLOVE BIOGEL PI IND STRL 7.5 (GLOVE) ×1 IMPLANT
GLOVE ECLIPSE 7.0 STRL STRAW (GLOVE) ×1 IMPLANT
GOWN STRL REUS W/TWL LRG LVL3 (GOWN DISPOSABLE) ×2 IMPLANT
KIT ABG SYR 3ML LUER SLIP (SYRINGE) IMPLANT
NDL HYPO 25X5/8 SAFETYGLIDE (NEEDLE) IMPLANT
NEEDLE HYPO 25X5/8 SAFETYGLIDE (NEEDLE) IMPLANT
NS IRRIG 1000ML POUR BTL (IV SOLUTION) ×1 IMPLANT
PACK C SECTION WH (CUSTOM PROCEDURE TRAY) ×1 IMPLANT
PAD OB MATERNITY 4.3X12.25 (PERSONAL CARE ITEMS) ×1 IMPLANT
STRIP CLOSURE SKIN 1/2X4 (GAUZE/BANDAGES/DRESSINGS) IMPLANT
SUT PDS AB 0 CTX 60 (SUTURE) ×2 IMPLANT
SUT PLAIN 0 NONE (SUTURE) IMPLANT
SUT VIC AB 0 CT1 36 (SUTURE) IMPLANT
SUT VIC AB 0 CTX 36 (SUTURE) ×3
SUT VIC AB 0 CTX36XBRD ANBCTRL (SUTURE) ×3 IMPLANT
SUT VIC AB 4-0 KS 27 (SUTURE) ×1 IMPLANT
TOWEL OR 17X24 6PK STRL BLUE (TOWEL DISPOSABLE) ×1 IMPLANT
TRAY FOLEY W/BAG SLVR 14FR LF (SET/KITS/TRAYS/PACK) ×1 IMPLANT
WATER STERILE IRR 1000ML POUR (IV SOLUTION) ×1 IMPLANT

## 2023-05-18 NOTE — Progress Notes (Signed)
S/p cytotec x 2 Now 4/100/-1 AROM clear fluid  Iupc placed Currently no pitocin as she was laboring on own.  Cat 2 tracing for intermittent lates and occ variables. + scalp stim. Overall reassuring.    E Osmel Dykstra

## 2023-05-18 NOTE — Op Note (Signed)
Operative Note  PROCEDURE DATE: 05/18/23   PREOPERATIVE DIAGNOSIS: Nonreassuring fetal heart tracing   POSTOPERATIVE DIAGNOSIS: The same   PROCEDURE:    Primary Low Transverse Cesarean Section   SURGEON:  Dr. Nilda Simmer   INDICATIONS: This is a 22 y.o. yo G1P0 at [redacted]w[redacted]d requiring cesarean section secondary to nonreassuring fetal heart tracing.  She underwent elective induction of labor and did not progress beyond 4 cm dilation.  FHT was notable for bouts of repetitive late decels after AROM and these resolved with position changes.  We were unable to begin pitocin due to intermittent late decelerations and labor was inadequate.  FHT then became more notable for recurrent and deeper late decelerations.   Decision made to proceed with LTCS. The risks of cesarean section discussed with the patient included but were not limited to: bleeding which may require transfusion or reoperation; infection which may require antibiotics; injury to bowel, bladder, ureters or other surrounding organs; injury to the fetus; need for additional procedures including hysterectomy in the event of a life-threatening hemorrhage; placental abnormalities wth subsequent pregnancies, incisional problems, thromboembolic phenomenon and other postoperative/anesthesia complications. The patient agreed with the proposed plan, giving informed consent for the procedure.     FINDINGS:  Viable female infant in vertex presentation, OP and asynclitic, APGARs p4 and 8,  Weight pending, Amniotic fluid light mec stained,  Intact placenta, three vessel cord.  Grossly normal uterus.  Arterial umbilical cord gas pH 7.28. Venous is 7.16, given lower reported venous this value may actually represent arterial.  .   ANESTHESIA:    Epidural ESTIMATED BLOOD LOSS: per record SPECIMENS: Placenta for routine COMPLICATIONS: None immediate    PROCEDURE IN DETAIL:  The patient received intravenous antibiotics (2g Ancef and azithromycin) and had  sequential compression devices applied to her lower extremities while in the preoperative area.  She was then taken to the operating room where epidural anesthesia was dosed up to surgical level and was found to be adequate. She was then placed in a dorsal supine position with a leftward tilt, and prepped and draped in a sterile manner.  A foley catheter was placed into her bladder and attached to constant gravity.  After an adequate timeout was performed, a Pfannenstiel skin incision was made with scalpel and carried through to the underlying layer of fascia. The fascia was incised in the midline and this incision was extended bilaterally using the Mayo scissors. Kocher clamps were applied to the superior aspect of the fascial incision and the underlying rectus muscles were dissected off bluntly. A similar process was carried out on the inferior aspect of the facial incision. The rectus muscles were separated in the midline bluntly and the peritoneum was entered bluntly.  A bladder flap was created sharply and developed bluntly. A transverse hysterotomy was made with a scalpel and extended bilaterally bluntly. The bladder blade was then removed. The infant was successfully delivered, and cord was clamped and cut and infant was handed over to awaiting neonatology team. Uterine massage was then administered and the placenta delivered intact with three-vessel cord. Cord gases collected. The uterus was cleared of clot and debris.  The hysterotomy was closed with 0 vicryl.  A second imbricating suture of 0-vicryl was used to reinforce the incision and aid in hemostasis.The fascia was closed with 0-Vicryl in a running fashion with good restoration of anatomy.  The subcutaneus tissue was irrigated and was reapproximated using running plain gut.  The skin was closed with 4-0 Vicryl in  a subcuticular fashion.  All surgical site and was hemostatic at end of procedure without any further bleeding on exam.    Pt tolerated  the procedure well. All sponge/lap/needle counts were correct  X 2. Pt taken to recovery room in stable condition.     Nilda Simmer MD

## 2023-05-18 NOTE — Anesthesia Preprocedure Evaluation (Signed)
Anesthesia Evaluation  Patient identified by MRN, date of birth, ID band Patient awake    Reviewed: Allergy & Precautions, H&P , Patient's Chart, lab work & pertinent test results  Airway Mallampati: II  TM Distance: >3 FB Neck ROM: Full    Dental no notable dental hx.    Pulmonary neg pulmonary ROS   Pulmonary exam normal breath sounds clear to auscultation       Cardiovascular negative cardio ROS Normal cardiovascular exam Rhythm:Regular Rate:Normal     Neuro/Psych negative neurological ROS  negative psych ROS   GI/Hepatic negative GI ROS, Neg liver ROS,,,  Endo/Other  negative endocrine ROS    Renal/GU negative Renal ROS  negative genitourinary   Musculoskeletal negative musculoskeletal ROS (+)    Abdominal   Peds negative pediatric ROS (+)  Hematology negative hematology ROS (+) Hb 12.2, plt 266   Anesthesia Other Findings   Reproductive/Obstetrics (+) Pregnancy                             Anesthesia Physical Anesthesia Plan  ASA: 2  Anesthesia Plan: Epidural   Post-op Pain Management:    Induction:   PONV Risk Score and Plan: 2  Airway Management Planned: Natural Airway  Additional Equipment: None  Intra-op Plan:   Post-operative Plan:   Informed Consent: I have reviewed the patients History and Physical, chart, labs and discussed the procedure including the risks, benefits and alternatives for the proposed anesthesia with the patient or authorized representative who has indicated his/her understanding and acceptance.       Plan Discussed with:   Anesthesia Plan Comments:        Anesthesia Quick Evaluation

## 2023-05-18 NOTE — Progress Notes (Signed)
Chaplain responded to Neonatal Code Blue.  Pt had accidentally hit the button.  Code Blue cancelled.  Vernell Morgans Chaplain

## 2023-05-18 NOTE — Anesthesia Postprocedure Evaluation (Signed)
Anesthesia Post Note  Patient: Gabriela Clay  Procedure(s) Performed: CESAREAN SECTION     Patient location during evaluation: PACU Anesthesia Type: Epidural Level of consciousness: awake Pain management: pain level controlled Vital Signs Assessment: post-procedure vital signs reviewed and stable Respiratory status: spontaneous breathing, nonlabored ventilation and respiratory function stable Cardiovascular status: stable Postop Assessment: no headache, no backache and epidural receding Anesthetic complications: no   No notable events documented.  Last Vitals:  Vitals:   05/18/23 1230 05/18/23 1315  BP: (!) 100/58 100/72  Pulse: 61 60  Resp: 15 16  Temp:    SpO2: 98% 100%    Last Pain:  Vitals:   05/18/23 1315  TempSrc:   PainSc: 0-No pain   Pain Goal:                   Gabriela Clay

## 2023-05-18 NOTE — Anesthesia Procedure Notes (Signed)
Epidural Patient location during procedure: OB Start time: 05/18/2023 2:18 AM End time: 05/18/2023 2:27 AM  Staffing Anesthesiologist: Lannie Fields, DO Performed: anesthesiologist   Preanesthetic Checklist Completed: patient identified, IV checked, risks and benefits discussed, monitors and equipment checked, pre-op evaluation and timeout performed  Epidural Patient position: sitting Prep: DuraPrep and site prepped and draped Patient monitoring: continuous pulse ox, blood pressure, heart rate and cardiac monitor Approach: midline Location: L3-L4 Injection technique: LOR air  Needle:  Needle type: Tuohy  Needle gauge: 17 G Needle length: 9 cm Needle insertion depth: 4.5 cm Catheter type: closed end flexible Catheter size: 19 Gauge Catheter at skin depth: 10 cm Test dose: negative  Assessment Sensory level: T8 Events: blood not aspirated, no cerebrospinal fluid, injection not painful, no injection resistance, no paresthesia and negative IV test  Additional Notes Patient identified. Risks/Benefits/Options discussed with patient including but not limited to bleeding, infection, nerve damage, paralysis, failed block, incomplete pain control, headache, blood pressure changes, nausea, vomiting, reactions to medication both or allergic, itching and postpartum back pain. Confirmed with bedside nurse the patient's most recent platelet count. Confirmed with patient that they are not currently taking any anticoagulation, have any bleeding history or any family history of bleeding disorders. Patient expressed understanding and wished to proceed. All questions were answered. Sterile technique was used throughout the entire procedure. Please see nursing notes for vital signs. Test dose was given through epidural catheter and negative prior to continuing to dose epidural or start infusion. Warning signs of high block given to the patient including shortness of breath, tingling/numbness in  hands, complete motor block, or any concerning symptoms with instructions to call for help. Patient was given instructions on fall risk and not to get out of bed. All questions and concerns addressed with instructions to call with any issues or inadequate analgesia.  Reason for block:procedure for pain

## 2023-05-18 NOTE — Transfer of Care (Signed)
Immediate Anesthesia Transfer of Care Note  Patient: Gabriela Clay  Procedure(s) Performed: CESAREAN SECTION  Patient Location: PACU  Anesthesia Type:Epidural  Level of Consciousness: awake, alert , and oriented  Airway & Oxygen Therapy: Patient Spontanous Breathing  Post-op Assessment: Report given to RN and Post -op Vital signs reviewed and stable  Post vital signs: Reviewed and stable  Last Vitals:  Vitals Value Taken Time  BP 104/56 05/18/23 1200  Temp    Pulse 81 05/18/23 1203  Resp 14 05/18/23 1203  SpO2 100 % 05/18/23 1203  Vitals shown include unvalidated device data.  Last Pain:  Vitals:   05/18/23 0900  TempSrc:   PainSc: 0-No pain         Complications: No notable events documented.

## 2023-05-18 NOTE — Progress Notes (Signed)
Quick Note  Patient has had intermittent cat II tracing throughout the day, with moderate variability throughout.  Recently late variables have returned and are recurrent, deeper.  At the present moment lates have improved however we have been unable to start pit and achieve adequate labor and we are remote from delivery.  Primary C section recommended.  Patient was counseled on the risks of Cesarean delivery, which include but are not limited to bleeding, infection, damage to nearby organs including bowel/bladder/ureter, need for additional procedure or blood transfusion, and implications for future pregnancy.  Patient agreeable to procedure, all questions answered.   Gabriela Clay

## 2023-05-19 ENCOUNTER — Other Ambulatory Visit: Payer: Self-pay

## 2023-05-19 LAB — CBC
HCT: 30.8 % — ABNORMAL LOW (ref 36.0–46.0)
Hemoglobin: 10.1 g/dL — ABNORMAL LOW (ref 12.0–15.0)
MCH: 28 pg (ref 26.0–34.0)
MCHC: 32.8 g/dL (ref 30.0–36.0)
MCV: 85.3 fL (ref 80.0–100.0)
Platelets: 229 10*3/uL (ref 150–400)
RBC: 3.61 MIL/uL — ABNORMAL LOW (ref 3.87–5.11)
RDW: 12.6 % (ref 11.5–15.5)
WBC: 12.7 10*3/uL — ABNORMAL HIGH (ref 4.0–10.5)
nRBC: 0 % (ref 0.0–0.2)

## 2023-05-19 MED ORDER — RHO D IMMUNE GLOBULIN 1500 UNIT/2ML IJ SOSY
300.0000 ug | PREFILLED_SYRINGE | Freq: Once | INTRAMUSCULAR | Status: DC
  Filled 2023-05-19: qty 2

## 2023-05-19 NOTE — Progress Notes (Signed)
Subjective: Postpartum Day 1: Cesarean Delivery Patient reports tolerating PO and no problems voiding.  Desires circ.  Objective: Vital signs in last 24 hours: Temp:  [97.8 F (36.6 C)-98.4 F (36.9 C)] 98.4 F (36.9 C) (06/19 0545) Pulse Rate:  [56-144] 86 (06/19 0545) Resp:  [14-18] 18 (06/19 0545) BP: (84-150)/(53-135) 107/71 (06/19 0545) SpO2:  [95 %-100 %] 100 % (06/19 0545)  Physical Exam:  General: alert, cooperative, and appears stated age 22: appropriate Uterine Fundus: firm Incision: healing well, no significant drainage, no dehiscence DVT Evaluation: No evidence of DVT seen on physical exam. Negative Homan's sign. No cords or calf tenderness.  Recent Labs    05/17/23 1652 05/19/23 0428  HGB 12.2 10.1*  HCT 36.0 30.8*    Assessment/Plan: Status post Cesarean section. Doing well postoperatively.  Continue current care. Circ-patient is counseled re: risk of bleeding, infection and scarring.  All questions were answered and will proceed.  Mitchel Honour, DO 05/19/2023, 8:19 AM

## 2023-05-20 MED ORDER — IBUPROFEN 600 MG PO TABS: 600.0000 mg | ORAL_TABLET | Freq: Four times a day (QID) | ORAL | 0 refills | Status: AC

## 2023-05-20 NOTE — Discharge Instructions (Signed)
WHAT TO LOOK OUT FOR: Fever of 100.4 or above Mastitis: feels like flu and breasts hurt Infection: increased pain, swelling or redness Blood clots golf ball size or larger Postpartum depression   Congratulations on your newest addition! 

## 2023-05-20 NOTE — Discharge Summary (Signed)
Postpartum Discharge Summary       Patient Name: Gabriela Clay DOB: [birth year] MRN: [id number]  Date of admission: 05/17/2023 Delivery date:05/18/2023  Delivering provider: Damaris Hippo A  Date of discharge: 05/20/2023  Admitting diagnosis: Pregnancy [Z34.90] Intrauterine pregnancy: [redacted]w[redacted]d     Secondary diagnosis:  Principal Problem:   Pregnancy  Additional problems:      Discharge diagnosis: Term Pregnancy Delivered                                              Post partum procedures:   Augmentation: AROM, Pitocin, and Cytotec Complications: None  Hospital course: Induction of Labor With Cesarean Section   22 y.o. yo G1P1001 at [redacted]w[redacted]d was admitted to the hospital 05/17/2023 for induction of labor. Patient had a labor course significant for nonreassuring FHR. The patient went for cesarean section due to Non-Reassuring FHR. Delivery details are as follows: Membrane Rupture Time/Date: 5:46 AM ,05/18/2023   Delivery Method:C-Section, Low Transverse  Details of operation can be found in separate operative Note.  Patient had a postpartum course uncomplicated. She is ambulating, tolerating a regular diet, passing flatus, and urinating well.  Patient is discharged home in stable condition on 05/20/23.      Newborn Data: Birth date:05/18/2023  Birth time:11:05 AM  Gender:Female  Living status:Living  Apgars:4 ,8  Weight:3060 g                                Magnesium Sulfate received: No BMZ received: No Rhophylac:N/A MMR:N/A T-DaP:Given prenatally Flu: N/A Transfusion:No  Physical exam  Vitals:   05/19/23 0545 05/19/23 1655 05/19/23 2120 05/20/23 0524  BP: 107/71 116/75 107/70 97/66  Pulse: 86 90 91 87  Resp: 18 17 18 19   Temp: 98.4 F (36.9 C) 97.7 F (36.5 C) 97.7 F (36.5 C) 98.3 F (36.8 C)  TempSrc: Oral Oral Oral Oral  SpO2: 100% 99% 96% 94%  Weight:      Height:       General: alert, cooperative, and no distress Lochia: appropriate Uterine Fundus:  firm Incision: Healing well with no significant drainage DVT Evaluation: No evidence of DVT seen on physical exam. Labs: Lab Results  Component Value Date   WBC 12.7 (H) 05/19/2023   HGB 10.1 (L) 05/19/2023   HCT 30.8 (L) 05/19/2023   MCV 85.3 05/19/2023   PLT 229 05/19/2023      Latest Ref Rng & Units 05/09/2023   12:53 PM  CMP  Glucose 70 - 99 mg/dL 82   BUN 6 - 20 mg/dL 6   Creatinine 7.82 - 9.56 mg/dL 2.13   Sodium 086 - 578 mmol/L 134   Potassium 3.5 - 5.1 mmol/L 5.0   Chloride 98 - 111 mmol/L 102   CO2 22 - 32 mmol/L 21   Calcium 8.9 - 10.3 mg/dL 9.3   Total Protein 6.5 - 8.1 g/dL 6.8   Total Bilirubin 0.3 - 1.2 mg/dL 0.5   Alkaline Phos 38 - 126 U/L 228   AST 15 - 41 U/L 16   ALT 0 - 44 U/L 8    Edinburgh Score:    05/19/2023   11:34 PM  Edinburgh Postnatal Depression Scale Screening Tool  I have been able to laugh and see the funny side of things. 0  I have looked forward with enjoyment to things. 0  I have blamed myself unnecessarily when things went wrong. 1  I have been anxious or worried for no good reason. 2  I have felt scared or panicky for no good reason. 0  Things have been getting on top of me. 0  I have been so unhappy that I have had difficulty sleeping. 0  I have felt sad or miserable. 0  I have been so unhappy that I have been crying. 0  The thought of harming myself has occurred to me. 0  Edinburgh Postnatal Depression Scale Total 3      After visit meds:  Allergies as of 05/20/2023       Reactions   Sulfamethoxazole-trimethoprim Itching, Swelling        Medication List     TAKE these medications    ibuprofen 600 MG tablet Commonly known as: ADVIL Take 1 tablet (600 mg total) by mouth every 6 (six) hours.   prenatal multivitamin Tabs tablet Take 1 tablet by mouth daily at 12 noon.         Discharge home in stable condition Infant Feeding: Bottle Infant Disposition:home with mother Discharge instruction: per After Visit  Summary and Postpartum booklet. Activity: Advance as tolerated. Pelvic rest for 6 weeks.  Diet: routine diet Anticipated Birth Control: Unsure Postpartum Appointment:6 weeks Additional Postpartum F/U:    Future Appointments:No future appointments. Follow up Visit:      05/20/2023 Turner Daniels, MD

## 2023-05-24 ENCOUNTER — Encounter (HOSPITAL_COMMUNITY): Payer: BC Managed Care – PPO

## 2023-05-24 ENCOUNTER — Inpatient Hospital Stay (HOSPITAL_COMMUNITY): Payer: BC Managed Care – PPO

## 2023-06-07 ENCOUNTER — Telehealth (HOSPITAL_COMMUNITY): Payer: Self-pay | Admitting: *Deleted

## 2023-06-07 NOTE — Telephone Encounter (Signed)
06/07/2023  Name: Gabriela Clay MRN: [id number] DOB: 24-Jul-2001  Reason for Call:  Transition of Care Hospital Discharge Call  Contact Status: Patient Contact Status: Complete  Language assistant needed:          Follow-Up Questions: Do You Have Any Concerns About Your Health As You Heal From Delivery?: No Do You Have Any Concerns About Your Infants Health?: No  Edinburgh Postnatal Depression Scale:  In the Past 7 Days: I have been able to laugh and see the funny side of things.: As much as I always could I have looked forward with enjoyment to things.: As much as I ever did I have blamed myself unnecessarily when things went wrong.: Not very often I have been anxious or worried for no good reason.: Hardly ever I have felt scared or panicky for no good reason.: No, not at all Things have been getting on top of me.: No, I have been coping as well as ever I have been so unhappy that I have had difficulty sleeping.: Not at all I have felt sad or miserable.: No, not at all I have been so unhappy that I have been crying.: Only occasionally The thought of harming myself has occurred to me.: Never Inocente Salles Postnatal Depression Scale Total: 3  PHQ2-9 Depression Scale:     Discharge Follow-up: Edinburgh score requires follow up?: No Patient was advised of the following resources:: Breastfeeding Support Group, Support Group Requested email information regarding Baby and Me class. Email sent. Post-discharge interventions: Reviewed Newborn Safe Sleep Practices  Signature Deforest Hoyles, RN, [phone]

## 2023-06-24 DIAGNOSIS — Z1389 Encounter for screening for other disorder: Secondary | ICD-10-CM | POA: Diagnosis not present

## 2024-04-06 ENCOUNTER — Other Ambulatory Visit: Payer: Self-pay | Admitting: Family

## 2024-04-06 ENCOUNTER — Ambulatory Visit
Admission: RE | Admit: 2024-04-06 | Discharge: 2024-04-06 | Disposition: A | Discharge status: Home / Self Care | Source: Ambulatory Visit | Attending: Family | Admitting: Family

## 2024-04-06 DIAGNOSIS — M545 Low back pain, unspecified: Secondary | ICD-10-CM
